# Patient Record
Sex: Female | Born: 1965 | Race: White | Hispanic: No | Marital: Married | Smoking: Never smoker
Health system: Southern US, Community
[De-identification: ages and names within clinical notes are randomized; demographics above are authoritative.]

## PROBLEM LIST (undated history)

## (undated) HISTORY — PX: ABDOMINAL HYSTERECTOMY: SHX81

---

## 2010-03-14 ENCOUNTER — Encounter: Admission: RE | Admit: 2010-03-14 | Discharge: 2010-03-14 | Payer: Self-pay | Admitting: Specialist

## 2016-01-27 ENCOUNTER — Other Ambulatory Visit: Payer: Self-pay | Admitting: Obstetrics and Gynecology

## 2016-01-27 DIAGNOSIS — Z1231 Encounter for screening mammogram for malignant neoplasm of breast: Secondary | ICD-10-CM

## 2016-02-02 ENCOUNTER — Ambulatory Visit
Admission: RE | Admit: 2016-02-02 | Discharge: 2016-02-02 | Disposition: A | Discharge status: Home / Self Care | Payer: BC Managed Care – PPO | Source: Ambulatory Visit | Attending: Obstetrics and Gynecology | Admitting: Obstetrics and Gynecology

## 2016-02-02 ENCOUNTER — Encounter: Payer: Self-pay | Admitting: Radiology

## 2016-02-02 DIAGNOSIS — Z1231 Encounter for screening mammogram for malignant neoplasm of breast: Secondary | ICD-10-CM

## 2017-11-07 ENCOUNTER — Other Ambulatory Visit: Payer: Self-pay | Admitting: Physician Assistant

## 2017-11-07 DIAGNOSIS — Z1231 Encounter for screening mammogram for malignant neoplasm of breast: Secondary | ICD-10-CM

## 2017-11-28 ENCOUNTER — Ambulatory Visit
Admission: RE | Admit: 2017-11-28 | Discharge: 2017-11-28 | Disposition: A | Discharge status: Home / Self Care | Payer: BC Managed Care – PPO | Source: Ambulatory Visit | Attending: Physician Assistant | Admitting: Physician Assistant

## 2017-11-28 DIAGNOSIS — Z1231 Encounter for screening mammogram for malignant neoplasm of breast: Secondary | ICD-10-CM

## 2018-12-09 ENCOUNTER — Other Ambulatory Visit: Payer: Self-pay | Admitting: Physician Assistant

## 2018-12-09 DIAGNOSIS — Z1231 Encounter for screening mammogram for malignant neoplasm of breast: Secondary | ICD-10-CM

## 2018-12-10 ENCOUNTER — Ambulatory Visit
Admission: RE | Admit: 2018-12-10 | Discharge: 2018-12-10 | Disposition: A | Discharge status: Home / Self Care | Payer: BC Managed Care – PPO | Source: Ambulatory Visit | Attending: Physician Assistant | Admitting: Physician Assistant

## 2018-12-10 ENCOUNTER — Other Ambulatory Visit: Payer: Self-pay

## 2018-12-10 DIAGNOSIS — Z1231 Encounter for screening mammogram for malignant neoplasm of breast: Secondary | ICD-10-CM

## 2020-01-19 ENCOUNTER — Emergency Department (HOSPITAL_COMMUNITY)
Admission: EM | Admit: 2020-01-19 | Discharge: 2020-01-20 | Disposition: A | Discharge status: Home / Self Care | Payer: BC Managed Care – PPO | Attending: Emergency Medicine | Admitting: Emergency Medicine

## 2020-01-19 ENCOUNTER — Encounter (HOSPITAL_COMMUNITY): Payer: Self-pay

## 2020-01-19 ENCOUNTER — Emergency Department (HOSPITAL_COMMUNITY): Payer: BC Managed Care – PPO

## 2020-01-19 ENCOUNTER — Other Ambulatory Visit: Payer: Self-pay

## 2020-01-19 DIAGNOSIS — Y939 Activity, unspecified: Secondary | ICD-10-CM | POA: Insufficient documentation

## 2020-01-19 DIAGNOSIS — W108XXA Fall (on) (from) other stairs and steps, initial encounter: Secondary | ICD-10-CM | POA: Diagnosis not present

## 2020-01-19 DIAGNOSIS — M25572 Pain in left ankle and joints of left foot: Secondary | ICD-10-CM | POA: Diagnosis present

## 2020-01-19 DIAGNOSIS — Y999 Unspecified external cause status: Secondary | ICD-10-CM | POA: Diagnosis not present

## 2020-01-19 DIAGNOSIS — S99912A Unspecified injury of left ankle, initial encounter: Secondary | ICD-10-CM | POA: Diagnosis not present

## 2020-01-19 DIAGNOSIS — Y9289 Other specified places as the place of occurrence of the external cause: Secondary | ICD-10-CM | POA: Diagnosis not present

## 2020-01-19 NOTE — ED Provider Notes (Signed)
[facility] EMERGENCY DEPARTMENT Provider Note   CSN: [id number] Arrival date & time: 01/19/20  0044     History Chief Complaint  Patient presents with  . Ankle Pain    Mary Spears is a 54 y.o. female.  HPI      Mary Spears is a 54 y.o. female, patient with no pertinent past medical history, presenting to the ED with left ankle injury that occurred shortly prior to arrival. Patient states she misstepped and stumbled on some steps, inverting her left ankle.  She states she realized she injured the ankle so she stayed in place and lowered herself to the ground.  Initially with severe, throbbing pain to the left ankle, but improved after elevation and ice. Denies anticoagulation. Denies head injury, neck/back pain, numbness, weakness, other extremity injuries.    History reviewed. No pertinent past medical history.  There are no problems to display for this patient.   Past Surgical History:  Procedure Laterality Date  . ABDOMINAL HYSTERECTOMY       OB History   No obstetric history on file.     History reviewed. No pertinent family history.  Social History   Tobacco Use  . Smoking status: Never Smoker  . Smokeless tobacco: Never Used  Substance Use Topics  . Alcohol use: Never  . Drug use: Never    Home Medications Prior to Admission medications   Not on File    Allergies    Patient has no known allergies.  Review of Systems   Review of Systems  Musculoskeletal: Positive for arthralgias and joint swelling. Negative for back pain and neck pain.  Neurological: Negative for weakness and numbness.    Physical Exam Updated Vital Signs BP 119/73 (BP Location: Right Arm)   Pulse 68   Temp 98.3 F (36.8 C) (Oral)   Resp 18   SpO2 100%   Physical Exam Vitals and nursing note reviewed.  Constitutional:      General: She is not in acute distress.    Appearance: She is well-developed. She is not diaphoretic.  HENT:      Head: Normocephalic and atraumatic.  Eyes:     Conjunctiva/sclera: Conjunctivae normal.  Cardiovascular:     Rate and Rhythm: Normal rate and regular rhythm.     Pulses:          Dorsalis pedis pulses are 2+ on the left side.       Posterior tibial pulses are 2+ on the left side.  Pulmonary:     Effort: Pulmonary effort is normal.  Musculoskeletal:     Cervical back: Neck supple.     Comments: Tenderness and swelling to the left lateral malleolus.  No noted deformity or instability. No tenderness to the foot, left lower leg, knee, or left upper leg. Full range of motion intact without pain or noted difficulty in the left knee and hip.  Skin:    General: Skin is warm and dry.     Capillary Refill: Capillary refill takes less than 2 seconds.     Coloration: Skin is not pale.  Neurological:     Mental Status: She is alert.     Comments: Sensation light touch grossly intact in left foot and toes. Appropriate motor function intact in the left toes. Strength 5/5 in left knee.  Psychiatric:        Behavior: Behavior normal.     ED Results / Procedures / Treatments   Labs (all labs ordered are listed, but  only abnormal results are displayed) Labs Reviewed - No data to display  EKG None  Radiology DG Ankle Complete Left  Result Date: 01/19/2020 CLINICAL DATA:  Pain, swelling, injury EXAM: LEFT ANKLE COMPLETE - 3+ VIEW COMPARISON:  None. FINDINGS: Lateral soft tissue swelling. No acute bony abnormality. Specifically, no fracture, subluxation, or dislocation. Joint spaces maintained. IMPRESSION: Negative. Electronically Signed   By: Charlett Nose M.D.   On: 01/19/2020 01:35    Procedures Procedures (including critical care time)  Medications Ordered in ED Medications - No data to display  ED Course  I have reviewed the triage vital signs and the nursing notes.  Pertinent labs & imaging results that were available during my care of the patient were reviewed by me and considered  in my medical decision making (see chart for details).    MDM Rules/Calculators/A&P                          Patient presents with an isolated injury to left ankle. No evidence of neurovascular compromise. Personally reviewed and interpreted the patient's x-rays. No acute osseous abnormality noted on x-ray. Placed in ankle brace, given crutches, and PCP versus Ortho follow-up. The patient was given instructions for home care as well as return precautions. Patient voices understanding of these instructions, accepts the plan, and is comfortable with discharge.    Final Clinical Impression(s) / ED Diagnoses Final diagnoses:  Injury of left ankle, initial encounter    Rx / DC Orders ED Discharge Orders    None       Concepcion Living 01/19/20 0920    Jacalyn Lefevre, MD 01/19/20 [phone]

## 2020-01-19 NOTE — Progress Notes (Signed)
Orthopedic Tech Progress Note Patient Details:  Junnie Loschiavo Dec 31, 1965 [id number]  Ortho Devices Type of Ortho Device: Crutches, ASO Ortho Device/Splint Location: LLE Ortho Device/Splint Interventions: Ordered, Application, Adjustment   Post Interventions Patient Tolerated: Well, Ambulated well Instructions Provided: Poper ambulation with device, Care of device, Adjustment of device   Donald Pore 01/19/2020, 8:51 AM

## 2020-01-19 NOTE — ED Notes (Signed)
Significant swelling to left lateral ankle, elevated and ice applied.

## 2020-01-19 NOTE — ED Triage Notes (Signed)
Pt states that she has a mechanical fall, missed the last step and twisted her L ankle. Swelling noted

## 2020-01-19 NOTE — Discharge Instructions (Signed)
You have been seen today for an ankle injury. There were no acute abnormalities on the x-rays, including no sign of fracture or dislocation, however, there could be injuries to the soft tissues, such as the ligaments or tendons that are not seen on xrays. There could also be what are called occult fractures that are small fractures not seen on xray. Antiinflammatory medications: Take 600 mg of ibuprofen every 6 hours or 440 mg (over the counter dose) to 500 mg (prescription dose) of naproxen every 12 hours for the next 3 days. After this time, these medications may be used as needed for pain. Take these medications with food to avoid upset stomach. Choose only one of these medications, do not take them together. Acetaminophen (generic for Tylenol): Should you continue to have additional pain while taking the ibuprofen or naproxen, you may add in acetaminophen as needed. Your daily total maximum amount of acetaminophen from all sources should be limited to 4000mg /day for persons without liver problems, or 2000mg /day for those with liver problems. Ice: May apply ice to the area over the next 24 hours for 15 minutes at a time to reduce swelling. Elevation: Keep the extremity elevated as often as possible to reduce pain and inflammation. Support: Wear the ankle brace for support and comfort. Wear this until pain resolves. You will be weight-bearing as tolerated, which means you can slowly start to put weight on the extremity and increase amount and frequency as pain allows. Follow up: If symptoms are improving, you may follow up with your primary care provider for any continued management. If symptoms are not starting to improve within a week, you should follow up with the orthopedic specialist within two weeks. Return: Return to the ED for numbness, weakness, increasing pain, overall worsening symptoms, loss of function, or if symptoms are not improving, you have tried to follow up with the orthopedic  specialist, and have been unable to do so.  For prescription assistance, may try using prescription discount sites or apps, such as goodrx.com or Good Rx smart phone app.

## 2020-02-17 ENCOUNTER — Other Ambulatory Visit: Payer: Self-pay | Admitting: Obstetrics and Gynecology

## 2020-02-17 DIAGNOSIS — Z1231 Encounter for screening mammogram for malignant neoplasm of breast: Secondary | ICD-10-CM

## 2020-02-25 ENCOUNTER — Ambulatory Visit
Admission: RE | Admit: 2020-02-25 | Discharge: 2020-02-25 | Disposition: A | Discharge status: Home / Self Care | Payer: BC Managed Care – PPO | Source: Ambulatory Visit

## 2020-02-25 ENCOUNTER — Other Ambulatory Visit: Payer: Self-pay

## 2020-02-25 DIAGNOSIS — Z1231 Encounter for screening mammogram for malignant neoplasm of breast: Secondary | ICD-10-CM

## 2021-02-15 ENCOUNTER — Other Ambulatory Visit: Payer: Self-pay | Admitting: Obstetrics and Gynecology

## 2021-02-15 DIAGNOSIS — Z1231 Encounter for screening mammogram for malignant neoplasm of breast: Secondary | ICD-10-CM

## 2021-02-27 ENCOUNTER — Ambulatory Visit
Admission: RE | Admit: 2021-02-27 | Discharge: 2021-02-27 | Disposition: A | Discharge status: Home / Self Care | Payer: BC Managed Care – PPO | Source: Ambulatory Visit

## 2021-02-27 ENCOUNTER — Other Ambulatory Visit: Payer: Self-pay

## 2021-02-27 DIAGNOSIS — Z1231 Encounter for screening mammogram for malignant neoplasm of breast: Secondary | ICD-10-CM

## 2021-11-03 IMAGING — MG DIGITAL SCREENING BILAT W/ TOMO W/ CAD
8 series · 8 of 24 positions shown · non-contrast
Comparison: Previous exam(s).

CLINICAL DATA: Screening.

EXAM:
DIGITAL SCREENING BILATERAL MAMMOGRAM WITH TOMO AND CAD

[L MLO synth-2D]
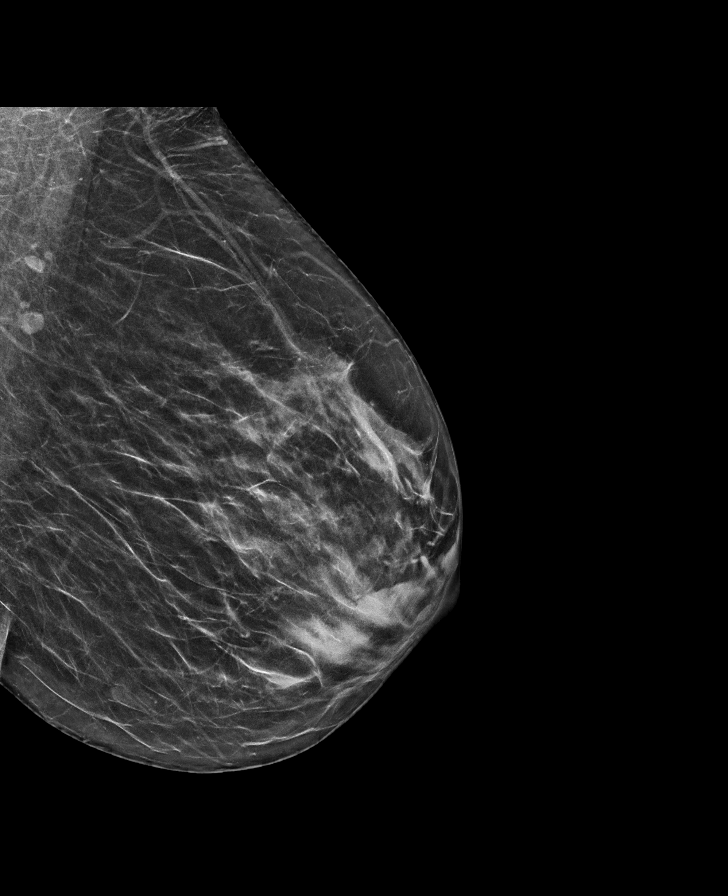

[L CC synth-2D]
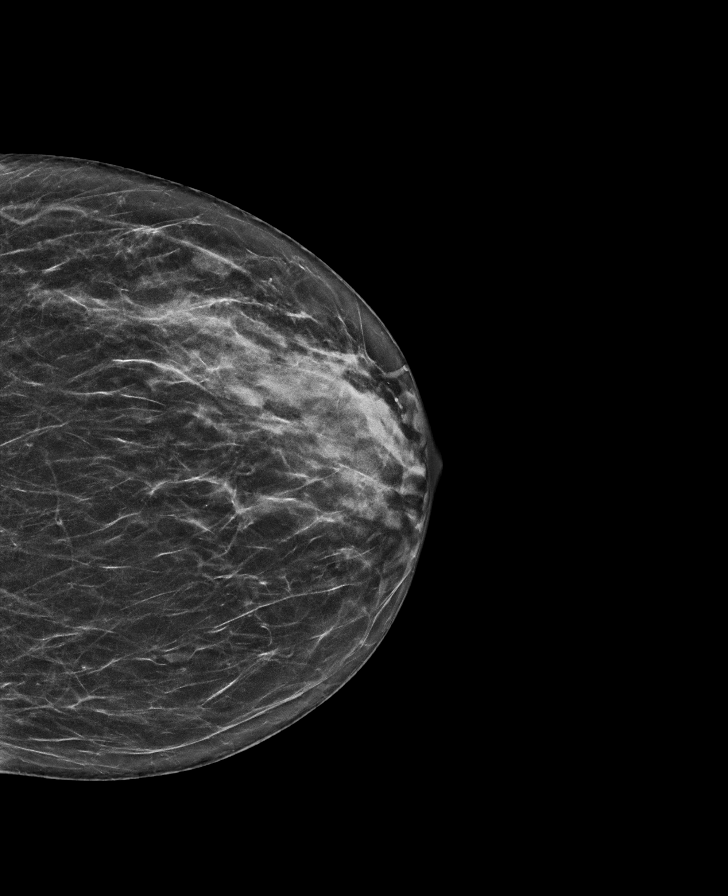

[R CC synth-2D]
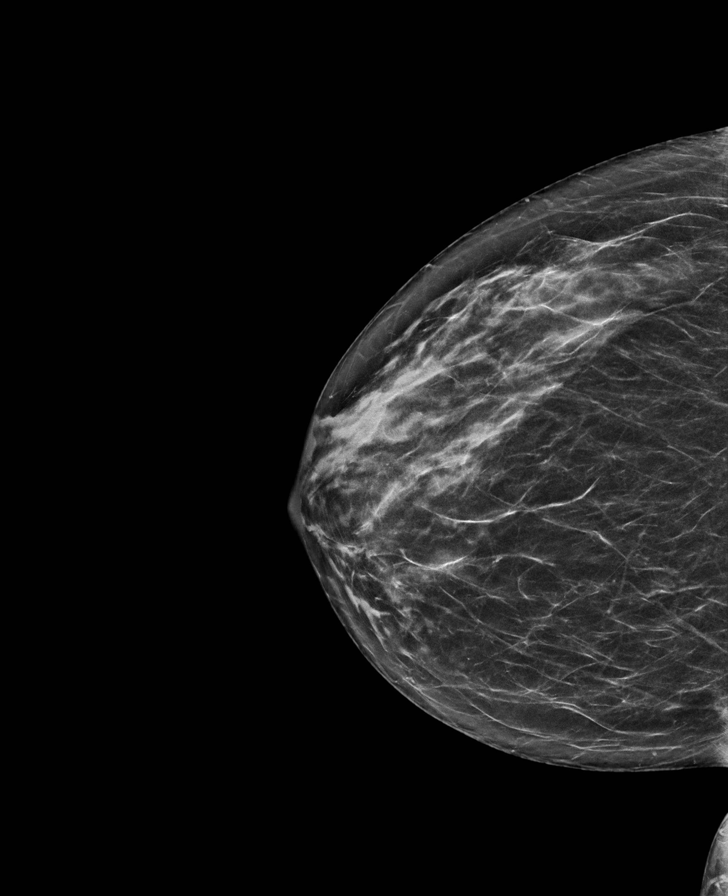

[R MLO synth-2D]
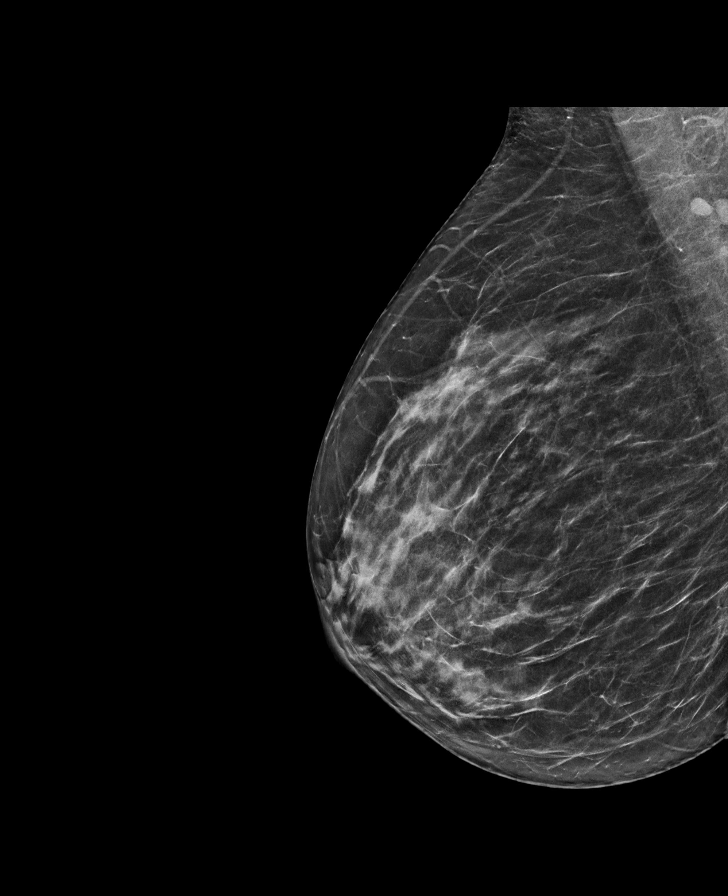

[R CC tomo · tomo slice 31/62.0]
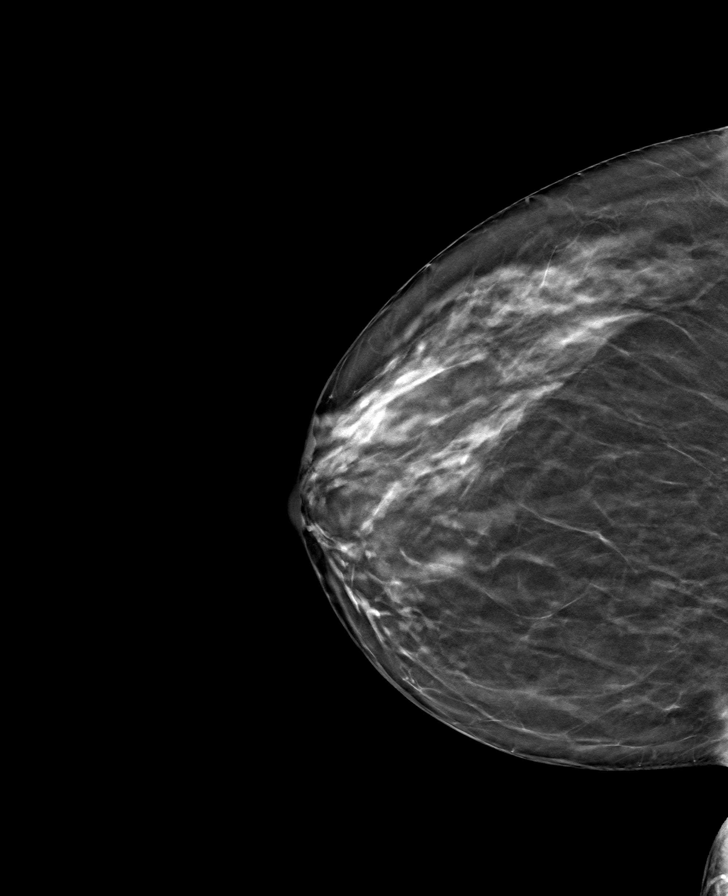

[L CC tomo · tomo slice 29/58.0]
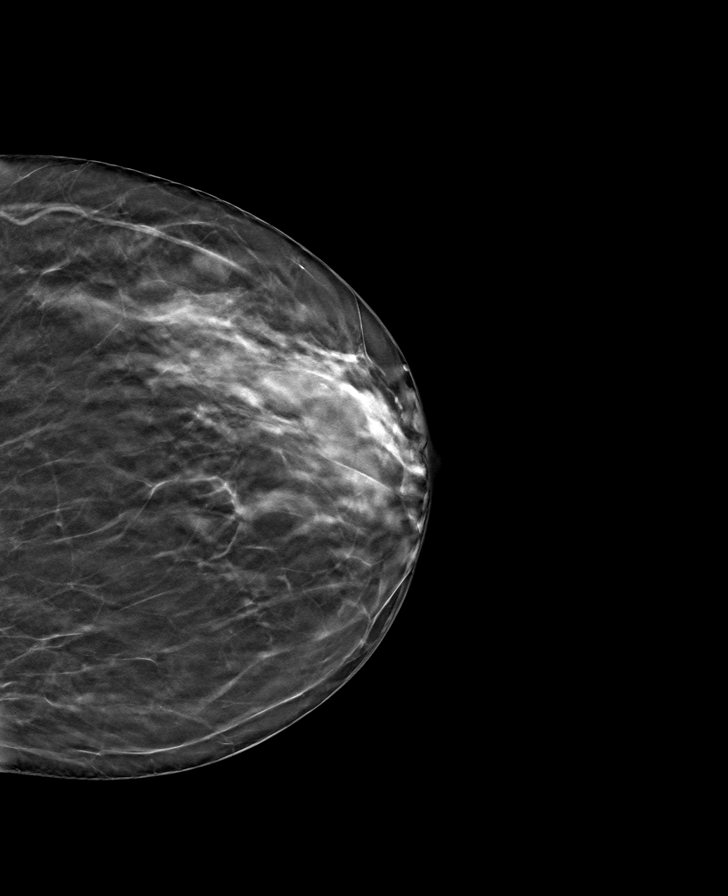

[R MLO tomo · tomo slice 33/64.0]
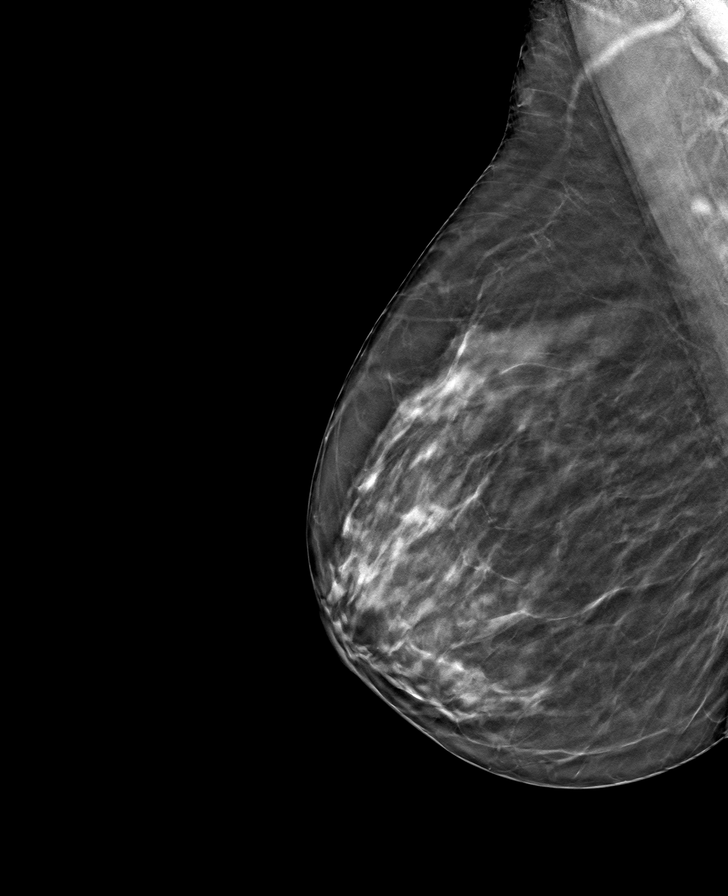

[L MLO tomo · tomo slice 32/63.0]
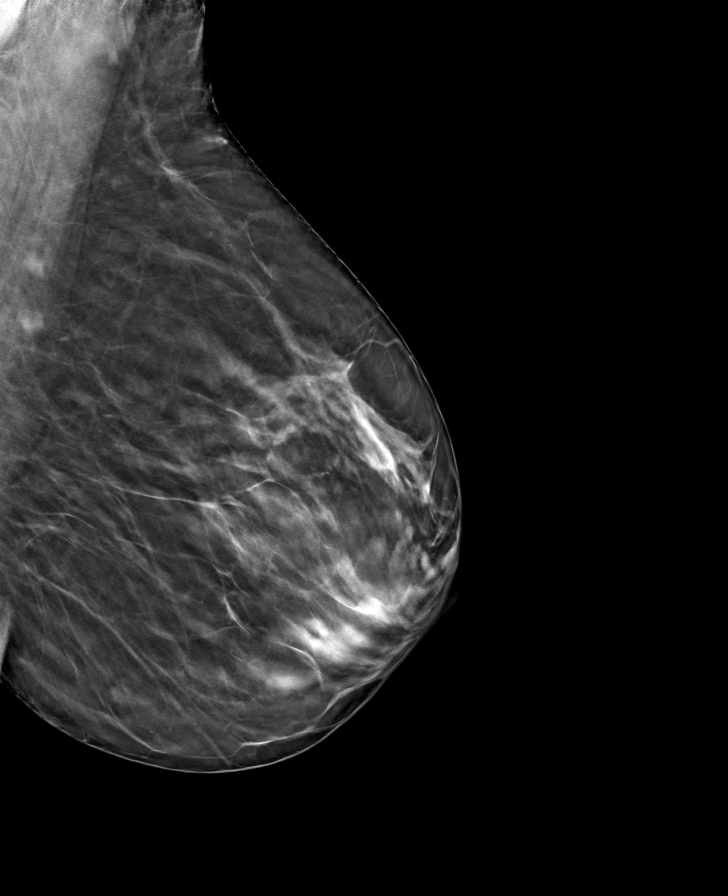

[8 of 24 positions shown; findings below may reference images not displayed]

ACR Breast Density Category c: The breast tissue is heterogeneously
dense, which may obscure small masses.
FINDINGS: There are no findings suspicious for malignancy. Images were
processed with CAD.
IMPRESSION: No mammographic evidence of malignancy. A result letter of this
screening mammogram will be mailed directly to the patient.

RECOMMENDATION:
Screening mammogram in one year. (Code:[id number]-U-LHB)

BI-RADS CATEGORY  1: Negative.

## 2022-02-20 ENCOUNTER — Other Ambulatory Visit: Payer: Self-pay | Admitting: Obstetrics and Gynecology

## 2022-02-20 DIAGNOSIS — Z1231 Encounter for screening mammogram for malignant neoplasm of breast: Secondary | ICD-10-CM

## 2022-03-27 ENCOUNTER — Ambulatory Visit: Payer: BC Managed Care – PPO

## 2022-04-13 ENCOUNTER — Ambulatory Visit
Admission: RE | Admit: 2022-04-13 | Discharge: 2022-04-13 | Disposition: A | Discharge status: Home / Self Care | Payer: BC Managed Care – PPO | Source: Ambulatory Visit | Attending: Obstetrics and Gynecology | Admitting: Obstetrics and Gynecology

## 2022-04-13 DIAGNOSIS — Z1231 Encounter for screening mammogram for malignant neoplasm of breast: Secondary | ICD-10-CM

## 2023-08-10 ENCOUNTER — Other Ambulatory Visit: Payer: Self-pay | Admitting: Obstetrics and Gynecology

## 2023-08-10 DIAGNOSIS — Z0000 Encounter for general adult medical examination without abnormal findings: Secondary | ICD-10-CM

## 2023-08-13 ENCOUNTER — Ambulatory Visit
Admission: RE | Admit: 2023-08-13 | Discharge: 2023-08-13 | Disposition: A | Discharge status: Home / Self Care | Source: Ambulatory Visit | Attending: Physician Assistant | Admitting: Physician Assistant

## 2023-08-13 DIAGNOSIS — Z0000 Encounter for general adult medical examination without abnormal findings: Secondary | ICD-10-CM
